# Patient Record
Sex: Male | Born: 1975
Health system: Southern US, Community
[De-identification: ages and names within clinical notes are randomized; demographics above are authoritative.]

---

## 1998-08-02 ENCOUNTER — Ambulatory Visit (HOSPITAL_COMMUNITY): Admission: RE | Admit: 1998-08-02 | Discharge: 1998-08-02 | Payer: Self-pay | Admitting: Family Medicine

## 1998-08-19 ENCOUNTER — Encounter: Admission: RE | Admit: 1998-08-19 | Discharge: 1998-08-19 | Payer: Self-pay | Admitting: *Deleted

## 1998-12-02 ENCOUNTER — Encounter: Payer: Self-pay | Admitting: Orthopedic Surgery

## 1998-12-02 ENCOUNTER — Ambulatory Visit (HOSPITAL_COMMUNITY): Admission: RE | Admit: 1998-12-02 | Discharge: 1998-12-02 | Payer: Self-pay | Admitting: Orthopedic Surgery

## 2017-03-30 DIAGNOSIS — R030 Elevated blood-pressure reading, without diagnosis of hypertension: Secondary | ICD-10-CM | POA: Diagnosis not present

## 2017-03-30 DIAGNOSIS — R079 Chest pain, unspecified: Secondary | ICD-10-CM | POA: Diagnosis not present

## 2017-03-30 DIAGNOSIS — M940 Chondrocostal junction syndrome [Tietze]: Secondary | ICD-10-CM | POA: Diagnosis not present

## 2017-12-06 DIAGNOSIS — Z0000 Encounter for general adult medical examination without abnormal findings: Secondary | ICD-10-CM | POA: Diagnosis not present

## 2018-11-22 DIAGNOSIS — L290 Pruritus ani: Secondary | ICD-10-CM | POA: Diagnosis not present

## 2018-11-22 DIAGNOSIS — H579 Unspecified disorder of eye and adnexa: Secondary | ICD-10-CM | POA: Diagnosis not present

## 2018-12-13 DIAGNOSIS — H9190 Unspecified hearing loss, unspecified ear: Secondary | ICD-10-CM | POA: Diagnosis not present

## 2018-12-13 DIAGNOSIS — Z0000 Encounter for general adult medical examination without abnormal findings: Secondary | ICD-10-CM | POA: Diagnosis not present

## 2018-12-13 DIAGNOSIS — Z13220 Encounter for screening for lipoid disorders: Secondary | ICD-10-CM | POA: Diagnosis not present

## 2020-02-22 ENCOUNTER — Emergency Department (HOSPITAL_COMMUNITY): Payer: 59

## 2020-02-22 ENCOUNTER — Other Ambulatory Visit: Payer: Self-pay

## 2020-02-22 ENCOUNTER — Inpatient Hospital Stay (HOSPITAL_COMMUNITY)
Admission: EM | Admit: 2020-02-22 | Discharge: 2020-02-24 | DRG: 482 | Disposition: A | Discharge status: Home / Self Care | Payer: 59 | Attending: Student | Admitting: Student

## 2020-02-22 ENCOUNTER — Encounter (HOSPITAL_COMMUNITY): Payer: Self-pay | Admitting: Emergency Medicine

## 2020-02-22 DIAGNOSIS — S72472A Torus fracture of lower end of left femur, initial encounter for closed fracture: Secondary | ICD-10-CM

## 2020-02-22 DIAGNOSIS — S72402A Unspecified fracture of lower end of left femur, initial encounter for closed fracture: Secondary | ICD-10-CM | POA: Diagnosis present

## 2020-02-22 DIAGNOSIS — X501XXA Overexertion from prolonged static or awkward postures, initial encounter: Secondary | ICD-10-CM

## 2020-02-22 DIAGNOSIS — T148XXA Other injury of unspecified body region, initial encounter: Secondary | ICD-10-CM

## 2020-02-22 DIAGNOSIS — S72352A Displaced comminuted fracture of shaft of left femur, initial encounter for closed fracture: Secondary | ICD-10-CM | POA: Diagnosis present

## 2020-02-22 DIAGNOSIS — Y92322 Soccer field as the place of occurrence of the external cause: Secondary | ICD-10-CM | POA: Diagnosis not present

## 2020-02-22 DIAGNOSIS — Y9366 Activity, soccer: Secondary | ICD-10-CM

## 2020-02-22 DIAGNOSIS — Z20822 Contact with and (suspected) exposure to covid-19: Secondary | ICD-10-CM | POA: Diagnosis present

## 2020-02-22 DIAGNOSIS — F17200 Nicotine dependence, unspecified, uncomplicated: Secondary | ICD-10-CM | POA: Diagnosis present

## 2020-02-22 DIAGNOSIS — Z419 Encounter for procedure for purposes other than remedying health state, unspecified: Secondary | ICD-10-CM

## 2020-02-22 DIAGNOSIS — W03XXXA Other fall on same level due to collision with another person, initial encounter: Secondary | ICD-10-CM | POA: Diagnosis present

## 2020-02-22 DIAGNOSIS — S72302A Unspecified fracture of shaft of left femur, initial encounter for closed fracture: Secondary | ICD-10-CM | POA: Diagnosis present

## 2020-02-22 DIAGNOSIS — M25562 Pain in left knee: Secondary | ICD-10-CM

## 2020-02-22 LAB — CBC WITH DIFFERENTIAL/PLATELET
Abs Immature Granulocytes: 0.05 10*3/uL (ref 0.00–0.07)
Basophils Absolute: 0 10*3/uL (ref 0.0–0.1)
Basophils Relative: 0 %
Eosinophils Absolute: 0 10*3/uL (ref 0.0–0.5)
Eosinophils Relative: 0 %
HCT: 40.9 % (ref 39.0–52.0)
Hemoglobin: 13.8 g/dL (ref 13.0–17.0)
Immature Granulocytes: 0 %
Lymphocytes Relative: 10 %
Lymphs Abs: 1.4 10*3/uL (ref 0.7–4.0)
MCH: 30.1 pg (ref 26.0–34.0)
MCHC: 33.7 g/dL (ref 30.0–36.0)
MCV: 89.3 fL (ref 80.0–100.0)
Monocytes Absolute: 0.5 10*3/uL (ref 0.1–1.0)
Monocytes Relative: 4 %
Neutro Abs: 11.8 10*3/uL — ABNORMAL HIGH (ref 1.7–7.7)
Neutrophils Relative %: 86 %
Platelets: 256 10*3/uL (ref 150–400)
RBC: 4.58 MIL/uL (ref 4.22–5.81)
RDW: 11.9 % (ref 11.5–15.5)
WBC: 13.8 10*3/uL — ABNORMAL HIGH (ref 4.0–10.5)
nRBC: 0 % (ref 0.0–0.2)

## 2020-02-22 LAB — RESPIRATORY PANEL BY RT PCR (FLU A&B, COVID)
Influenza A by PCR: NEGATIVE
Influenza B by PCR: NEGATIVE
SARS Coronavirus 2 by RT PCR: NEGATIVE

## 2020-02-22 LAB — BASIC METABOLIC PANEL
Anion gap: 11 (ref 5–15)
BUN: 18 mg/dL (ref 6–20)
CO2: 25 mmol/L (ref 22–32)
Calcium: 9.5 mg/dL (ref 8.9–10.3)
Chloride: 102 mmol/L (ref 98–111)
Creatinine, Ser: 1.08 mg/dL (ref 0.61–1.24)
GFR calc Af Amer: >60 mL/min (ref >60)
GFR calc non Af Amer: >60 mL/min (ref >60)
Glucose, Bld: 131 mg/dL — ABNORMAL HIGH (ref 70–99)
Potassium: 3.8 mmol/L (ref 3.5–5.1)
Sodium: 138 mmol/L (ref 135–145)

## 2020-02-22 MED ORDER — DOCUSATE SODIUM 100 MG PO CAPS
100.0000 mg | ORAL_CAPSULE | Freq: Two times a day (BID) | ORAL | Status: DC
  Administered 2020-02-23 – 2020-02-24 (×2): 100 mg via ORAL
  Filled 2020-02-22 (×2): qty 1

## 2020-02-22 MED ORDER — ONDANSETRON HCL 4 MG PO TABS: 4.0000 mg | ORAL_TABLET | Freq: Four times a day (QID) | ORAL | Status: DC | PRN

## 2020-02-22 MED ORDER — METOCLOPRAMIDE HCL 10 MG PO TABS: 5.0000 mg | ORAL_TABLET | Freq: Three times a day (TID) | ORAL | Status: DC | PRN

## 2020-02-22 MED ORDER — HYDROMORPHONE HCL 1 MG/ML IJ SOLN
1.0000 mg | INTRAMUSCULAR | Status: DC | PRN
  Administered 2020-02-22 – 2020-02-23 (×2): 1 mg via INTRAVENOUS
  Filled 2020-02-22 (×2): qty 1

## 2020-02-22 MED ORDER — OXYCODONE HCL 5 MG PO TABS
10.0000 mg | ORAL_TABLET | ORAL | Status: DC | PRN
  Administered 2020-02-23: 15 mg via ORAL
  Filled 2020-02-22: qty 3

## 2020-02-22 MED ORDER — ONDANSETRON HCL 4 MG/2ML IJ SOLN
4.0000 mg | Freq: Once | INTRAMUSCULAR | Status: AC
  Administered 2020-02-22: 4 mg via INTRAVENOUS
  Filled 2020-02-22: qty 2

## 2020-02-22 MED ORDER — ENOXAPARIN SODIUM 40 MG/0.4ML ~~LOC~~ SOLN
40.0000 mg | SUBCUTANEOUS | Status: DC
  Administered 2020-02-23: 40 mg via SUBCUTANEOUS
  Filled 2020-02-22: qty 0.4

## 2020-02-22 MED ORDER — POTASSIUM CHLORIDE IN NACL 20-0.9 MEQ/L-% IV SOLN
INTRAVENOUS | Status: DC
  Filled 2020-02-22: qty 1000

## 2020-02-22 MED ORDER — METHOCARBAMOL 1000 MG/10ML IJ SOLN
500.0000 mg | Freq: Four times a day (QID) | INTRAVENOUS | Status: DC | PRN
  Filled 2020-02-22: qty 5

## 2020-02-22 MED ORDER — ACETAMINOPHEN 325 MG PO TABS
650.0000 mg | ORAL_TABLET | Freq: Four times a day (QID) | ORAL | Status: DC
  Administered 2020-02-23 – 2020-02-24 (×6): 650 mg via ORAL
  Filled 2020-02-22 (×6): qty 2

## 2020-02-22 MED ORDER — ONDANSETRON HCL 4 MG/2ML IJ SOLN
4.0000 mg | Freq: Four times a day (QID) | INTRAMUSCULAR | Status: DC | PRN
  Administered 2020-02-23: 11:00:00 4 mg via INTRAVENOUS

## 2020-02-22 MED ORDER — METHOCARBAMOL 500 MG PO TABS: 500.0000 mg | ORAL_TABLET | Freq: Four times a day (QID) | ORAL | Status: DC | PRN

## 2020-02-22 MED ORDER — METOCLOPRAMIDE HCL 5 MG/ML IJ SOLN: 5.0000 mg | Freq: Three times a day (TID) | INTRAMUSCULAR | Status: DC | PRN

## 2020-02-22 MED ORDER — HYDROMORPHONE HCL 1 MG/ML IJ SOLN
0.5000 mg | Freq: Once | INTRAMUSCULAR | Status: AC
  Administered 2020-02-22: 0.5 mg via INTRAVENOUS
  Filled 2020-02-22: qty 1

## 2020-02-22 MED ORDER — OXYCODONE HCL 5 MG PO TABS: 5.0000 mg | ORAL_TABLET | ORAL | Status: DC | PRN

## 2020-02-22 NOTE — ED Notes (Signed)
C-Collar removed per MD.

## 2020-02-22 NOTE — H&P (Signed)
Orthopaedic Trauma Service (OTS) Consult   Patient ID: Paul [name] MRN: [id number] DOB/AGE: 45-04-1976 45 y.o.  Reason for Consult: Left distal femur fracture Referring Physician: Dr. Deno Etienne, MD [facility])  HPI: Paul [name] is an 44 y.o. male is being seen in consultation at the request of Dr. Tyrone Nine for left distal femur fracture. Patient states he was playing soccer this evening when he tried to avoid a collision with another player he planted his foot and twisted. [place] to the ground had immediate pain in the left leg. Was brought to the emergency department for evaluation found to have a left distal femur fracture. Orthopedic trauma service was consulted for evaluation and management.  Patient was seen in the emergency department, is relatively comfortable currently. Denies any numbness or tingling of the leg. Denies injury to any of his other extremities. Does have a history of left foot fracture from a forklift injury about 10 years ago that was treated nonoperatively, but denies any other injuries or surgeries to the left lower extremity. Denies any pain in the left leg leading up to this injury. Works as a Freight forwarder for [employer]. Lives at home with his wife. Takes no medications, including no anticoagulants. Denies any significant past medical history  History reviewed. No pertinent past medical history.  History reviewed. No pertinent surgical history.  No family history on file.  Social History:  has no history on file for tobacco, alcohol, and drug.  Allergies: No Known Allergies  Medications: I have reviewed the patient's current medications.  ROS: Constitutional: No fever or chills Vision: No changes in vision ENT: No difficulty swallowing CV: No chest pain Pulm: No SOB or wheezing GI: No nausea or vomiting GU: No urgency or inability to hold urine Skin: No poor wound healing Neurologic: No numbness or tingling Psychiatric: No  depression or anxiety Heme: No bruising Allergic: No reaction to medications or food   Exam: Blood pressure 112/80, pulse 95, temperature 97.7 F (36.5 C), SpO2 95 %. General: Sitting up in bed, NAD Orientation: awake, alert and oriented x 3 Mood and Affect: Mood and affect appropriate, pleasant and cooperative Gait: Not assessed due to known fracture Coordination and balance: Within normal limits  Left Lower Extremity: Leg held in external rotation. Deformity noted to distal thigh. Tenderness with palpation of the thigh, nontender below the knee. Sensation is intact to light touch distally. Able to wiggle toes. Distal thigh edematous but compartments compressible. Lower leg compartments soft and compressible. 2+ DP pulse  Right Lower Extremity: Skin without lesions. No tenderness to palpation. Full painless ROM, full strength in each muscle groups without evidence of instability.   Bilateral Upper Extremities: Skin without lesions. No tenderness to palpation. Full painless ROM, full strength in each muscle groups without evidence of instability.   Medical Decision Making: Data: Imaging: AP and lateral views of the left femur as well as knee shows comminuted, displaced, angulated fracture through the distal diaphysis  Labs:  Results for orders placed or performed during the hospital encounter of 02/22/20 (from the past 24 hour(s))  CBC with Differential     Status: Abnormal   Collection Time: 02/22/20  8:46 PM  Result Value Ref Range   WBC 13.8 (H) 4.0 - 10.5 K/uL   RBC 4.58 4.22 - 5.81 MIL/uL   Hemoglobin 13.8 13.0 - 17.0 g/dL   HCT 40.9 39.0 - 52.0 %   MCV 89.3 80.0 - 100.0 fL   MCH 30.1 26.0 -  34.0 pg   MCHC 33.7 30.0 - 36.0 g/dL   RDW 32.9 92.4 - 26.8 %   Platelets 256 150 - 400 K/uL   nRBC 0.0 0.0 - 0.2 %   Neutrophils Relative % 86 %   Neutro Abs 11.8 (H) 1.7 - 7.7 K/uL   Lymphocytes Relative 10 %   Lymphs Abs 1.4 0.7 - 4.0 K/uL   Monocytes Relative 4 %   Monocytes  Absolute 0.5 0.1 - 1.0 K/uL   Eosinophils Relative 0 %   Eosinophils Absolute 0.0 0.0 - 0.5 K/uL   Basophils Relative 0 %   Basophils Absolute 0.0 0.0 - 0.1 K/uL   Immature Granulocytes 0 %   Abs Immature Granulocytes 0.05 0.00 - 0.07 K/uL  Basic metabolic panel     Status: Abnormal   Collection Time: 02/22/20  8:46 PM  Result Value Ref Range   Sodium 138 135 - 145 mmol/L   Potassium 3.8 3.5 - 5.1 mmol/L   Chloride 102 98 - 111 mmol/L   CO2 25 22 - 32 mmol/L   Glucose, Bld 131 (H) 70 - 99 mg/dL   BUN 18 6 - 20 mg/dL   Creatinine, Ser 3.41 0.61 - 1.24 mg/dL   Calcium 9.5 8.9 - 96.2 mg/dL   GFR calc non Af Amer >60 >60 mL/min   GFR calc Af Amer >60 >60 mL/min   Anion gap 11 5 - 15  Respiratory Panel by RT PCR (Flu A&B, Covid) - Nasopharyngeal Swab     Status: None   Collection Time: 02/22/20  8:53 PM   Specimen: Nasopharyngeal Swab  Result Value Ref Range   SARS Coronavirus 2 by RT PCR NEGATIVE NEGATIVE   Influenza A by PCR NEGATIVE NEGATIVE   Influenza B by PCR NEGATIVE NEGATIVE   .  Assessment/Plan: 44 year old male s/p twisting injury with fall, resulting in left femur fracture.  Would recommend proceeding with intramedullary nailing of the left femur. Will plan to admit patient to orthopaedic trauma service this evening with plans for fixation of fracture tomorrow. Will be placed in Buck's traction. NPO effective midnight. Bedrest.   Shawn Route. Ladonna Snide [facility] [phone] (office) [email or web address]

## 2020-02-22 NOTE — ED Notes (Signed)
This RN called Chief Strategy Officer to place [employer]

## 2020-02-22 NOTE — ED Triage Notes (Signed)
Patient playing soccer, dug cleat into ground and caught.  Patient and other players heard the "pop".  Patient was given Ketamine 16mg  and 100 mcg of fentanyl en route to ED.  Patient has swelling and deformity above left knee.  Crepitus felt.  CSMTs and pulses intact.

## 2020-02-22 NOTE — ED Provider Notes (Signed)
[place] [facility] EMERGENCY DEPARTMENT Provider Note   CSN: [id number] Arrival date & time: 02/22/20  1945     History Chief Complaint  Patient presents with  . Fall  . Leg Pain    Paul [name] is a 43 y.o. male.  44 yo M with a chief complaints of left leg pain.  The patient was playing soccer and he was trying to avoid a collision with another player and his foot planted and he was struck by the other player and he felt like his leg twisted while his foot was held still.  Had pain and deformity to the leg just above the knee.  Was not ambulatory afterwards.  EMS felt like they had crepitus there.  Applied a splint.  Given pain medicine with some mild improvement.  Patient denies other injury.  Denies head injury loss consciousness denies chest pain back pain abdominal pain.  The history is provided by the patient.  Fall This is a new problem. The current episode started less than 1 hour ago. The problem occurs constantly. The problem has not changed since onset.Pertinent negatives include no chest pain, no abdominal pain, no headaches and no shortness of breath. The symptoms are aggravated by bending and twisting. Nothing relieves the symptoms. He has tried nothing for the symptoms. The treatment provided no relief.  Leg Pain Associated symptoms: no fever        History reviewed. No pertinent past medical history.  There are no problems to display for this patient.   History reviewed. No pertinent surgical history.     No family history on file.  Social History   Tobacco Use  . Smoking status: Not on file  Substance Use Topics  . Alcohol use: Not on file  . Drug use: Not on file    Home Medications Prior to Admission medications   Not on File    Allergies    Patient has no known allergies.  Review of Systems   Review of Systems  Constitutional: Negative for chills and fever.  HENT: Negative for congestion and facial swelling.   Eyes:  Negative for discharge and visual disturbance.  Respiratory: Negative for shortness of breath.   Cardiovascular: Negative for chest pain and palpitations.  Gastrointestinal: Negative for abdominal pain, diarrhea and vomiting.  Musculoskeletal: Positive for arthralgias and myalgias.  Skin: Negative for color change and rash.  Neurological: Negative for tremors, syncope and headaches.  Psychiatric/Behavioral: Negative for confusion and dysphoric mood.    Physical Exam Updated Vital Signs BP 112/80   Pulse 95   Temp 97.7 F (36.5 C)   SpO2 95%   Physical Exam Vitals and nursing note reviewed.  Constitutional:      Appearance: He is well-developed.  HENT:     Head: Normocephalic and atraumatic.  Eyes:     Pupils: Pupils are equal, round, and reactive to light.  Neck:     Vascular: No JVD.  Cardiovascular:     Rate and Rhythm: Normal rate and regular rhythm.     Heart sounds: No murmur. No friction rub. No gallop.   Pulmonary:     Effort: No respiratory distress.     Breath sounds: No wheezing.  Abdominal:     General: There is no distension.     Tenderness: There is no guarding or rebound.  Musculoskeletal:        General: Tenderness present. Normal range of motion.     Cervical back: Normal range of motion and  neck supple.     Comments: Pain and edema mostly just above the left knee.  External rotation of the leg distal to the knee.  Pulse motor and sensation are intact distally.  Skin:    Coloration: Skin is not pale.     Findings: No rash.  Neurological:     Mental Status: He is alert and oriented to person, place, and time.  Psychiatric:        Behavior: Behavior normal.     ED Results / Procedures / Treatments   Labs (all labs ordered are listed, but only abnormal results are displayed) Labs Reviewed  CBC WITH DIFFERENTIAL/PLATELET - Abnormal; Notable for the following components:      Result Value   WBC 13.8 (*)    Neutro Abs 11.8 (*)    All other  components within normal limits  BASIC METABOLIC PANEL - Abnormal; Notable for the following components:   Glucose, Bld 131 (*)    All other components within normal limits  RESPIRATORY PANEL BY RT PCR (FLU A&B, COVID)    EKG None  Radiology DG Knee 1-2 Views Left  Result Date: 02/22/2020 CLINICAL DATA:  44 year old male with trauma to the left lower extremity. EXAM: LEFT KNEE - 1-2 VIEW; LEFT FEMUR 2 VIEWS COMPARISON:  None. FINDINGS: There is a comminuted and displaced and angulated fracture of the distal left femoral diaphysis with dorsal angulation of the distal fracture fragment. There is no dislocation. The bones are well mineralized. There is soft tissue swelling of the distal thigh which may represent hematoma. IMPRESSION: Comminuted, displaced and angulated fracture of the distal left femoral diaphysis. Electronically Signed   By: Elgie Collard M.D.   On: 02/22/2020 20:47   DG Femur Min 2 Views Left  Result Date: 02/22/2020 CLINICAL DATA:  44 year old male with trauma to the left lower extremity. EXAM: LEFT KNEE - 1-2 VIEW; LEFT FEMUR 2 VIEWS COMPARISON:  None. FINDINGS: There is a comminuted and displaced and angulated fracture of the distal left femoral diaphysis with dorsal angulation of the distal fracture fragment. There is no dislocation. The bones are well mineralized. There is soft tissue swelling of the distal thigh which may represent hematoma. IMPRESSION: Comminuted, displaced and angulated fracture of the distal left femoral diaphysis. Electronically Signed   By: Elgie Collard M.D.   On: 02/22/2020 20:47    Procedures Procedures (including critical care time)  Medications Ordered in ED Medications  HYDROmorphone (DILAUDID) injection 0.5 mg (0.5 mg Intravenous Given 02/22/20 2127)  ondansetron (ZOFRAN) injection 4 mg (4 mg Intravenous Given 02/22/20 2127)    ED Course  I have reviewed the triage vital signs and the nursing notes.  Pertinent labs & imaging  results that were available during my care of the patient were reviewed by me and considered in my medical decision making (see chart for details).    MDM Rules/Calculators/A&P                      44 yo M with a chief complaint of left leg pain.  Patient has significant pain to the distal femur.  Concern for possible fracture.  Strange mechanism for such a strong bone.  Plain films.  Reassess.  Plain film with distal femur fx.  Discussed with ortho Dr. Jena Gauss. Will admit.   The patients results and plan were reviewed and discussed.   Any x-rays performed were independently reviewed by myself.   Differential diagnosis were considered with the presenting  HPI.  Medications  HYDROmorphone (DILAUDID) injection 0.5 mg (0.5 mg Intravenous Given 02/22/20 2127)  ondansetron (ZOFRAN) injection 4 mg (4 mg Intravenous Given 02/22/20 2127)    Vitals:   02/22/20 2000 02/22/20 2045 02/22/20 2052 02/22/20 2053  BP: 113/72  112/80   Pulse:  90 99 95  Temp:      SpO2:  98% 96% 95%    Final diagnoses:  Closed torus fracture of distal end of left femur, initial encounter (HCC)    Admission/ observation were discussed with the admitting physician, patient and/or family and they are comfortable with the plan.   Final Clinical Impression(s) / ED Diagnoses Final diagnoses:  Closed torus fracture of distal end of left femur, initial encounter [facility])    Rx / DC Orders ED Discharge Orders    None       Melene Plan, DO 02/22/20 2143

## 2020-02-23 ENCOUNTER — Encounter (HOSPITAL_COMMUNITY)
Admission: EM | Disposition: A | Discharge status: Home / Self Care | Payer: Self-pay | Source: Home / Self Care | Attending: Student

## 2020-02-23 ENCOUNTER — Inpatient Hospital Stay (HOSPITAL_COMMUNITY): Payer: 59

## 2020-02-23 ENCOUNTER — Other Ambulatory Visit: Payer: Self-pay

## 2020-02-23 ENCOUNTER — Encounter (HOSPITAL_COMMUNITY): Payer: Self-pay | Admitting: Student

## 2020-02-23 ENCOUNTER — Inpatient Hospital Stay (HOSPITAL_COMMUNITY): Payer: 59 | Admitting: Anesthesiology

## 2020-02-23 HISTORY — PX: FEMUR IM NAIL: SHX1597

## 2020-02-23 LAB — MRSA PCR SCREENING: MRSA by PCR: NEGATIVE

## 2020-02-23 SURGERY — INSERTION, INTRAMEDULLARY ROD, FEMUR, RETROGRADE
Anesthesia: General | Laterality: Left

## 2020-02-23 MED ORDER — CEFAZOLIN SODIUM-DEXTROSE 2-4 GM/100ML-% IV SOLN
INTRAVENOUS | Status: AC
  Filled 2020-02-23: qty 100

## 2020-02-23 MED ORDER — FENTANYL CITRATE (PF) 100 MCG/2ML IJ SOLN
INTRAMUSCULAR | Status: DC | PRN
  Administered 2020-02-23: 50 ug via INTRAVENOUS
  Administered 2020-02-23: 100 ug via INTRAVENOUS
  Administered 2020-02-23 (×2): 50 ug via INTRAVENOUS

## 2020-02-23 MED ORDER — PROMETHAZINE HCL 25 MG/ML IJ SOLN: 6.2500 mg | INTRAMUSCULAR | Status: DC | PRN

## 2020-02-23 MED ORDER — ACETAMINOPHEN 325 MG PO TABS
325.0000 mg | ORAL_TABLET | Freq: Once | ORAL | Status: DC | PRN
Start: 2020-02-23 — End: 2020-02-23

## 2020-02-23 MED ORDER — ACETAMINOPHEN 10 MG/ML IV SOLN: 1000.0000 mg | Freq: Once | INTRAVENOUS | Status: DC | PRN

## 2020-02-23 MED ORDER — FENTANYL CITRATE (PF) 250 MCG/5ML IJ SOLN
INTRAMUSCULAR | Status: AC
  Filled 2020-02-23: qty 5

## 2020-02-23 MED ORDER — DEXAMETHASONE SODIUM PHOSPHATE 10 MG/ML IJ SOLN
INTRAMUSCULAR | Status: AC
  Filled 2020-02-23: qty 1

## 2020-02-23 MED ORDER — CEFAZOLIN SODIUM-DEXTROSE 2-4 GM/100ML-% IV SOLN
2.0000 g | Freq: Three times a day (TID) | INTRAVENOUS | Status: AC
  Administered 2020-02-23 – 2020-02-24 (×3): 2 g via INTRAVENOUS
  Filled 2020-02-23 (×3): qty 100

## 2020-02-23 MED ORDER — MEPERIDINE HCL 25 MG/ML IJ SOLN
6.2500 mg | INTRAMUSCULAR | Status: DC | PRN
Start: 2020-02-23 — End: 2020-02-23

## 2020-02-23 MED ORDER — PROPOFOL 10 MG/ML IV BOLUS
INTRAVENOUS | Status: DC | PRN
  Administered 2020-02-23: 130 mg via INTRAVENOUS
  Administered 2020-02-23: 30 mg via INTRAVENOUS

## 2020-02-23 MED ORDER — PROPOFOL 10 MG/ML IV BOLUS
INTRAVENOUS | Status: AC
  Filled 2020-02-23: qty 20

## 2020-02-23 MED ORDER — POTASSIUM CHLORIDE IN NACL 20-0.9 MEQ/L-% IV SOLN
INTRAVENOUS | Status: DC
  Filled 2020-02-23 (×3): qty 1000

## 2020-02-23 MED ORDER — ACETAMINOPHEN 160 MG/5ML PO SOLN
325.0000 mg | Freq: Once | ORAL | Status: DC | PRN
Start: 2020-02-23 — End: 2020-02-23

## 2020-02-23 MED ORDER — ROCURONIUM BROMIDE 10 MG/ML (PF) SYRINGE
PREFILLED_SYRINGE | INTRAVENOUS | Status: AC
  Filled 2020-02-23: qty 10

## 2020-02-23 MED ORDER — LACTATED RINGERS IV SOLN: INTRAVENOUS | Status: DC

## 2020-02-23 MED ORDER — DEXAMETHASONE SODIUM PHOSPHATE 10 MG/ML IJ SOLN
INTRAMUSCULAR | Status: DC | PRN
  Administered 2020-02-23: 8 mg via INTRAVENOUS

## 2020-02-23 MED ORDER — ROCURONIUM BROMIDE 10 MG/ML (PF) SYRINGE
PREFILLED_SYRINGE | INTRAVENOUS | Status: DC | PRN
  Administered 2020-02-23: 50 mg via INTRAVENOUS

## 2020-02-23 MED ORDER — SUGAMMADEX SODIUM 200 MG/2ML IV SOLN
INTRAVENOUS | Status: DC | PRN
  Administered 2020-02-23: 200 mg via INTRAVENOUS

## 2020-02-23 MED ORDER — LIDOCAINE 2% (20 MG/ML) 5 ML SYRINGE
INTRAMUSCULAR | Status: AC
  Filled 2020-02-23: qty 5

## 2020-02-23 MED ORDER — MIDAZOLAM HCL 2 MG/2ML IJ SOLN
INTRAMUSCULAR | Status: AC
  Filled 2020-02-23: qty 2

## 2020-02-23 MED ORDER — LIDOCAINE 20MG/ML (2%) 15 ML SYRINGE OPTIME
INTRAMUSCULAR | Status: DC | PRN
  Administered 2020-02-23: 80 mg via INTRAVENOUS

## 2020-02-23 MED ORDER — CEFAZOLIN SODIUM-DEXTROSE 2-4 GM/100ML-% IV SOLN
2.0000 g | INTRAVENOUS | Status: AC
  Administered 2020-02-23: 2 g via INTRAVENOUS
  Filled 2020-02-23: qty 100

## 2020-02-23 MED ORDER — 0.9 % SODIUM CHLORIDE (POUR BTL) OPTIME
TOPICAL | Status: DC | PRN
  Administered 2020-02-23: 10:00:00 1000 mL

## 2020-02-23 MED ORDER — ONDANSETRON HCL 4 MG/2ML IJ SOLN
INTRAMUSCULAR | Status: AC
  Filled 2020-02-23: qty 2

## 2020-02-23 MED ORDER — HYDROMORPHONE HCL 1 MG/ML IJ SOLN: 0.2500 mg | INTRAMUSCULAR | Status: DC | PRN

## 2020-02-23 MED ORDER — MIDAZOLAM HCL 5 MG/5ML IJ SOLN
INTRAMUSCULAR | Status: DC | PRN
  Administered 2020-02-23: 2 mg via INTRAVENOUS

## 2020-02-23 SURGICAL SUPPLY — 67 items
ADH SKN CLS APL DERMABOND .7 (GAUZE/BANDAGES/DRESSINGS) ×2
APL PRP STRL LF DISP 70% ISPRP (MISCELLANEOUS) ×1
BIT DRILL CALIBRATED 4.3MMX365 (DRILL) IMPLANT
BIT DRILL CROWE PNT TWST 4.5MM (DRILL) IMPLANT
BNDG CMPR MED 15X6 ELC VLCR LF (GAUZE/BANDAGES/DRESSINGS) ×1
BNDG COHESIVE 4X5 TAN STRL (GAUZE/BANDAGES/DRESSINGS) ×3 IMPLANT
BNDG ELASTIC 4X5.8 VLCR STR LF (GAUZE/BANDAGES/DRESSINGS) ×3 IMPLANT
BNDG ELASTIC 6X15 VLCR STRL LF (GAUZE/BANDAGES/DRESSINGS) ×2 IMPLANT
BNDG ELASTIC 6X5.8 VLCR STR LF (GAUZE/BANDAGES/DRESSINGS) ×3 IMPLANT
BRUSH SCRUB EZ PLAIN DRY (MISCELLANEOUS) ×6 IMPLANT
CHLORAPREP W/TINT 26 (MISCELLANEOUS) ×3 IMPLANT
COVER MAYO STAND STRL (DRAPES) ×3 IMPLANT
COVER SURGICAL LIGHT HANDLE (MISCELLANEOUS) ×6 IMPLANT
COVER WAND RF STERILE (DRAPES) ×3 IMPLANT
DERMABOND ADVANCED (GAUZE/BANDAGES/DRESSINGS) ×4
DERMABOND ADVANCED .7 DNX12 (GAUZE/BANDAGES/DRESSINGS) ×1 IMPLANT
DRAPE C-ARM 42X72 X-RAY (DRAPES) ×3 IMPLANT
DRAPE C-ARMOR (DRAPES) ×3 IMPLANT
DRAPE HALF SHEET 40X57 (DRAPES) ×6 IMPLANT
DRAPE IMP U-DRAPE 54X76 (DRAPES) ×6 IMPLANT
DRAPE INCISE IOBAN 66X45 STRL (DRAPES) ×3 IMPLANT
DRAPE ORTHO SPLIT 77X108 STRL (DRAPES) ×6
DRAPE SURG 17X23 STRL (DRAPES) ×3 IMPLANT
DRAPE SURG ORHT 6 SPLT 77X108 (DRAPES) ×2 IMPLANT
DRAPE U-SHAPE 47X51 STRL (DRAPES) ×3 IMPLANT
DRILL CALIBRATED 4.3MMX365 (DRILL) ×3
DRILL CROWE POINT TWIST 4.5MM (DRILL) ×6
DRSG MEPILEX BORDER 4X4 (GAUZE/BANDAGES/DRESSINGS) ×6 IMPLANT
ELECT REM PT RETURN 9FT ADLT (ELECTROSURGICAL) ×3
ELECTRODE REM PT RTRN 9FT ADLT (ELECTROSURGICAL) ×1 IMPLANT
GAUZE SPONGE 4X4 12PLY STRL (GAUZE/BANDAGES/DRESSINGS) ×3 IMPLANT
GLOVE BIO SURGEON STRL SZ 6.5 (GLOVE) ×6 IMPLANT
GLOVE BIO SURGEON STRL SZ7.5 (GLOVE) ×12 IMPLANT
GLOVE BIO SURGEONS STRL SZ 6.5 (GLOVE) ×3
GLOVE BIOGEL PI IND STRL 6.5 (GLOVE) ×1 IMPLANT
GLOVE BIOGEL PI IND STRL 7.5 (GLOVE) ×1 IMPLANT
GLOVE BIOGEL PI INDICATOR 6.5 (GLOVE) ×2
GLOVE BIOGEL PI INDICATOR 7.5 (GLOVE) ×2
GOWN STRL REUS W/ TWL LRG LVL3 (GOWN DISPOSABLE) ×2 IMPLANT
GOWN STRL REUS W/TWL LRG LVL3 (GOWN DISPOSABLE) ×6
GUIDEPIN 3.2X17.5 THRD DISP (PIN) ×2 IMPLANT
GUIDEWIRE BEAD TIP (WIRE) ×2 IMPLANT
KIT BASIN OR (CUSTOM PROCEDURE TRAY) ×3 IMPLANT
KIT TURNOVER KIT B (KITS) ×3 IMPLANT
NAIL FEM RETRO 10.5X380 (Nail) ×2 IMPLANT
PACK ORTHO EXTREMITY (CUSTOM PROCEDURE TRAY) ×3 IMPLANT
PAD ARMBOARD 7.5X6 YLW CONV (MISCELLANEOUS) ×6 IMPLANT
PAD CAST 4YDX4 CTTN HI CHSV (CAST SUPPLIES) IMPLANT
PADDING CAST COTTON 4X4 STRL (CAST SUPPLIES) ×3
PADDING CAST COTTON 6X4 STRL (CAST SUPPLIES) ×2 IMPLANT
SCREW CORT TI DBL LEAD 5X36 (Screw) ×2 IMPLANT
SCREW CORT TI DBL LEAD 5X38 (Screw) ×2 IMPLANT
SCREW CORT TI DBL LEAD 5X56 (Screw) ×2 IMPLANT
SCREW CORT TI DBL LEAD 5X60 (Screw) ×2 IMPLANT
SCREW CORT TI DBL LEAD 5X70 (Screw) ×2 IMPLANT
SPONGE LAP 18X18 RF (DISPOSABLE) ×3 IMPLANT
STAPLER VISISTAT 35W (STAPLE) ×3 IMPLANT
SUT MNCRL AB 3-0 PS2 18 (SUTURE) ×3 IMPLANT
SUT VIC AB 0 CT1 27 (SUTURE)
SUT VIC AB 0 CT1 27XBRD ANBCTR (SUTURE) IMPLANT
SUT VIC AB 2-0 CT1 27 (SUTURE)
SUT VIC AB 2-0 CT1 TAPERPNT 27 (SUTURE) IMPLANT
TOWEL GREEN STERILE (TOWEL DISPOSABLE) ×6 IMPLANT
TOWEL GREEN STERILE FF (TOWEL DISPOSABLE) ×3 IMPLANT
TUBE CONNECTING 12'X1/4 (SUCTIONS) ×1
TUBE CONNECTING 12X1/4 (SUCTIONS) ×2 IMPLANT
YANKAUER SUCT BULB TIP NO VENT (SUCTIONS) ×3 IMPLANT

## 2020-02-23 NOTE — Anesthesia Procedure Notes (Signed)
Procedure Name: Intubation Date/Time: 02/23/2020 9:27 AM Performed by: Lowella Dell, CRNA Pre-anesthesia Checklist: Patient identified, Emergency Drugs available, Suction available and Patient being monitored Patient Re-evaluated:Patient Re-evaluated prior to induction Oxygen Delivery Method: Circle System Utilized Preoxygenation: Pre-oxygenation with 100% oxygen Induction Type: IV induction Ventilation: Mask ventilation without difficulty Laryngoscope Size: Mac and 4 Grade View: Grade I Tube type: Oral Tube size: 7.5 mm Number of attempts: 1 Airway Equipment and Method: Stylet Placement Confirmation: ETT inserted through vocal cords under direct vision,  positive ETCO2 and breath sounds checked- equal and bilateral Secured at: 22 cm Tube secured with: Tape Dental Injury: Teeth and Oropharynx as per pre-operative assessment

## 2020-02-23 NOTE — Plan of Care (Signed)
  Problem: Pain Managment: Goal: General experience of comfort will improve Outcome: Progressing   Problem: Safety: Goal: Ability to remain free from injury will improve Outcome: Progressing   

## 2020-02-23 NOTE — Plan of Care (Signed)

## 2020-02-23 NOTE — Op Note (Signed)
Orthopaedic Surgery Operative Note (CSN: [id number] ) Date of Surgery: 02/23/2020  Admit Date: 02/22/2020   Diagnoses: Pre-Op Diagnoses: Left distal femoral shaft fracture  Post-Op Diagnosis: Same  Procedures: CPT 27506-Intramedullary nailing of left femur fracture  Surgeons : Primary: Roby Lofts, MD  Assistant: Ulyses Southward, PA-C  Location: OR 3   Anesthesia:General  Antibiotics: Ancef 2g preop   Tourniquet time:None    Estimated Blood Loss: 75 mL  Complications:None  Specimens:None  Implants: Implant Name Type Inv. Item Serial No. Manufacturer Lot No. LRB No. Used Action  NAIL FEM RETRO 10.5X380 - [id number] Nail NAIL FEM RETRO 10.5X380  [employer](ORTH,TRAU,BIO,SG) [id number] Left 1 Implanted  SCREW CORT TI DBL LEAD 5X56 - [id number] Screw SCREW CORT TI DBL LEAD 5X56  [employer](ORTH,TRAU,BIO,SG) [id number] Left 1 Implanted  SCREW CORT TI DBL LEAD 5X70 - [id number] Screw SCREW CORT TI DBL LEAD 5X70  [employer](ORTH,TRAU,BIO,SG) [id number] Left 1 Implanted  SCREW CORT TI DBL LEAD 5X60 - [id number] Screw SCREW CORT TI DBL LEAD 5X60  [employer](ORTH,TRAU,BIO,SG) [id number] Left 1 Implanted  SCREW CORT TI DBL LEAD 5X38 - [id number] Screw SCREW CORT TI DBL LEAD 5X38  [employer](ORTH,TRAU,BIO,SG) [id number] Left 1 Implanted  SCREW CORT TI DBL LEAD 5X36 - [id number] Screw SCREW CORT TI DBL LEAD 5X36  [employer](ORTH,TRAU,BIO,SG) [id number] Left 1 Implanted     Indications for Surgery: 44 year old male who was playing soccer and twisted and got hit in his leg.  He had immediate pain and inability bear weight.  X-ray showed a distal femoral shaft fracture on the left.  I recommended proceeding with intramedullary nailing of his left femur.  Risks and benefits were discussed with the patient.  Risks included but not limited to bleeding, infection, malunion, nonunion, hardware failure, hardware irritation, nerve and blood vessel injury, DVT, knee stiffness, even the possibility of anesthetic  complications.  The patient agreed to proceed with surgery and consent was obtained.  Operative Findings: Retrograde intramedullary nailing of left femur using [employer] [place] nail 10.5 x 380 mm  Procedure: The patient was identified in the preoperative holding area. Consent was confirmed with the patient and their family and all questions were answered. The operative extremity was marked after confirmation with the patient. he was then brought back to the operating room by our anesthesia colleagues.  He was placed under general anesthetic and carefully transferred over to a radiolucent flat top table.  The left lower extremity was prepped and draped in usual sterile fashion.  A timeout was performed to verify the patient, the procedure, and the extremity.  Preoperative antibiotics were dosed.  Fluoroscopic imaging was obtained to show the unstable nature of his injury.  A reduction maneuver was performed with traction with the hip and knee flexed over a triangle.  Reduction was obtained.  A medial parapatellar incision was carried through skin subcutaneous tissue the retinaculum was split just medial to the patella tendon.  A curved Mayo was used to enter the knee joint.  A threaded guidewire was then used to find the appropriate starting point on AP and lateral fluoroscopic imaging.  It was directed in the center of the metaphysis in the distal femur.  An entry reamer was then used to enter the canal.  A ball-tipped guidewire was placed down the center canal and passed across the fracture into the proximal femur.  It was seated into the intertrochanteric femur region.  I measured the length and chose to use a 380 mm nail.  I  then sequentially reamed from 8.5 mm all the way to 12 mm.  Excellent chatter was obtained.  I chose to place a 10.5 mm nail.  The nail was passed down the center canal reduction was maintained.  It was seated until the distal portion of the nail was flush underneath the  articular surface of the knee.  Using the jig I was able to place 3 distal interlocking screws from lateral to medial.  I then compared rotation of the contralateral limb and with the knee in full extension I then used perfect circle technique to place proximal interlocking screws from anterior to posterior.  Final fluoroscopic imaging was obtained.  The incisions were copiously irrigated.  The incisions were closed with 2-0 Vicryl and 3-0 Monocryl.  The incisions were sealed with Dermabond.  Sterile dressings were placed.  Rotation was compared to the contralateral side which was symmetric.  The patient was awoken from anesthesia and taken to the PACU in stable condition.  Post Op Plan/Instructions: Patient will be weightbearing as tolerated to the left lower extremity.  He will receive postoperative Ancef.  He will receive Lovenox for DVT prophylaxis while inpatient and discharged home on aspirin 325 mg twice daily.  We will have him mobilize with physical therapy and likely discharge home on postoperative day 1 versus 2.  I was present and performed the entire surgery.  Patrecia Pace, PA-C did assist me throughout the case. An assistant was necessary given the difficulty in approach, maintenance of reduction and ability to instrument the fracture.   Katha Hamming, MD [facility]

## 2020-02-23 NOTE — Anesthesia Preprocedure Evaluation (Addendum)
Anesthesia Evaluation  Patient identified by MRN, date of birth, ID band Patient awake    Reviewed: Allergy & Precautions, NPO status , Patient's Chart, lab work & pertinent test results  Airway Mallampati: I  TM Distance: >3 FB Neck ROM: Full    Dental  (+) Teeth Intact, Dental Advisory Given   Pulmonary Current Smoker,    Pulmonary exam normal        Cardiovascular negative cardio ROS   Rhythm:Regular Rate:Normal     Neuro/Psych negative neurological ROS  negative psych ROS   GI/Hepatic negative GI ROS, Neg liver ROS,   Endo/Other  negative endocrine ROS  Renal/GU negative Renal ROS     Musculoskeletal negative musculoskeletal ROS (+)   Abdominal Normal abdominal exam  (+)   Peds  Hematology negative hematology ROS (+)   Anesthesia Other Findings   Reproductive/Obstetrics                            Anesthesia Physical Anesthesia Plan  ASA: I  Anesthesia Plan: General   Post-op Pain Management:    Induction: Intravenous  PONV Risk Score and Plan: 2 and Ondansetron and Midazolam  Airway Management Planned: Oral ETT  Additional Equipment: None  Intra-op Plan:   Post-operative Plan: Extubation in OR  Informed Consent: I have reviewed the patients History and Physical, chart, labs and discussed the procedure including the risks, benefits and alternatives for the proposed anesthesia with the patient or authorized representative who has indicated his/her understanding and acceptance.       Plan Discussed with: CRNA  Anesthesia Plan Comments:        Anesthesia Quick Evaluation

## 2020-02-23 NOTE — Transfer of Care (Signed)
Immediate Anesthesia Transfer of Care Note  Patient: Paul [name]  Procedure(s) Performed: INTRAMEDULLARY (IM) RETROGRADE FEMORAL NAILING (Left )  Patient Location: PACU  Anesthesia Type:General  Level of Consciousness: awake, alert  and patient cooperative  Airway & Oxygen Therapy: Patient Spontanous Breathing  Post-op Assessment: Report given to RN and Post -op Vital signs reviewed and stable  Post vital signs: Reviewed and stable  Last Vitals:  Vitals Value Taken Time  BP 119/76 02/23/20 1110  Temp    Pulse 101 02/23/20 1111  Resp 17 02/23/20 1111  SpO2 100 % 02/23/20 1111  Vitals shown include unvalidated device data.  Last Pain:  Vitals:   02/23/20 0810  TempSrc:   PainSc: 1          Complications: No apparent anesthesia complications

## 2020-02-23 NOTE — Anesthesia Postprocedure Evaluation (Signed)
Anesthesia Post Note  Patient: Paul [name]  Procedure(s) Performed: INTRAMEDULLARY (IM) RETROGRADE FEMORAL NAILING (Left )     Patient location during evaluation: PACU Anesthesia Type: General Level of consciousness: awake and alert Pain management: pain level controlled Vital Signs Assessment: post-procedure vital signs reviewed and stable Respiratory status: spontaneous breathing, nonlabored ventilation, respiratory function stable and patient connected to nasal cannula oxygen Cardiovascular status: blood pressure returned to baseline and stable Postop Assessment: no apparent nausea or vomiting Anesthetic complications: no    Last Vitals:  Vitals:   02/23/20 1134 02/23/20 1202  BP: 130/78 121/74  Pulse: 77 77  Resp: 15 16  Temp: 36.7 C 36.8 C  SpO2: 100% 100%    Last Pain:  Vitals:   02/23/20 1202  TempSrc: Oral  PainSc:                  Shelton Silvas

## 2020-02-24 ENCOUNTER — Encounter: Payer: Self-pay | Admitting: *Deleted

## 2020-02-24 LAB — CBC
HCT: 37 % — ABNORMAL LOW (ref 39.0–52.0)
Hemoglobin: 12.3 g/dL — ABNORMAL LOW (ref 13.0–17.0)
MCH: 30.2 pg (ref 26.0–34.0)
MCHC: 33.2 g/dL (ref 30.0–36.0)
MCV: 90.9 fL (ref 80.0–100.0)
Platelets: 250 10*3/uL (ref 150–400)
RBC: 4.07 MIL/uL — ABNORMAL LOW (ref 4.22–5.81)
RDW: 12.3 % (ref 11.5–15.5)
WBC: 11.4 10*3/uL — ABNORMAL HIGH (ref 4.0–10.5)
nRBC: 0 % (ref 0.0–0.2)

## 2020-02-24 MED ORDER — METHOCARBAMOL 500 MG PO TABS: 500.0000 mg | ORAL_TABLET | Freq: Four times a day (QID) | ORAL | 0 refills | Status: AC | PRN

## 2020-02-24 MED ORDER — ASPIRIN EC 325 MG PO TBEC
325.0000 mg | DELAYED_RELEASE_TABLET | Freq: Two times a day (BID) | ORAL | 0 refills | Status: AC
Start: 2020-02-24 — End: 2020-03-25

## 2020-02-24 MED ORDER — OXYCODONE-ACETAMINOPHEN 5-325 MG PO TABS: 1.0000 | ORAL_TABLET | ORAL | 0 refills | Status: AC | PRN

## 2020-02-24 NOTE — TOC Transition Note (Signed)
Transition of Care [facility]) - CM/SW Discharge Note   Patient Details  Name: Paul [name] MRN: [id number] Date of Birth: 10-14-1976  Transition of Care [facility]) CM/SW Contact:  Epifanio Lesches, RN Phone Number: [phone] 02/24/2020, 12:21 PM   Clinical Narrative:    Admitted with L distal femur fx from playing soccer.      -S/p L femoral IM nail 4/26  Pt will transition to home today. Outpatient PT referral made with [facility] and noted on AVS. DME; rolling walker will be delivered to beside prior to d/c.  Wife to provide transportation to home. Pt without Rx med concerns .... affordability.  Final next level of care: Home/Self Care Barriers to Discharge: No Barriers Identified   Patient Goals and CMS Choice     Choice offered to / list presented to : Patient  Discharge Placement                       Discharge Plan and Services   Discharge Planning Services: CM Consult            DME Arranged: Dan Humphreys rolling DME Agency: [employer]           Date HH Agency Contacted: 02/24/20 Time HH Agency Contacted: [phone] Representative spoke with at [facility] Agency: Zack  Social Determinants of Health (SDOH) Interventions     Readmission Risk Interventions No flowsheet data found.

## 2020-02-24 NOTE — Discharge Instructions (Signed)
Orthopaedic Trauma Service Discharge Instructions   General Discharge Instructions  WEIGHT BEARING STATUS: Weightbearing as tolerated left leg  RANGE OF MOTION/ACTIVITY: Okay for unrestricted knee and hip motion  Wound Care: You may remove your surgical dressings on post op day # 2 (Wednesday 02/25/20).  Incisions can be left open to air if there is no drainage. If incision continues to have drainage, follow wound care instructions below. Okay to shower if no drainage from incisions.  DVT/PE prophylaxis: Aspirin 325 mg twice daily  Diet: as you were eating previously.  Can use over the counter stool softeners and bowel preparations, such as Miralax, to help with bowel movements.  Narcotics can be constipating.  Be sure to drink plenty of fluids  PAIN MEDICATION USE AND EXPECTATIONS  You have likely been given narcotic medications to help control your pain.  After a traumatic event that results in an fracture (broken bone) with or without surgery, it is ok to use narcotic pain medications to help control one's pain.  We understand that everyone responds to pain differently and each individual patient will be evaluated on a regular basis for the continued need for narcotic medications. Ideally, narcotic medication use should last no more than 6-8 weeks (coinciding with fracture healing).   As a patient it is your responsibility as well to monitor narcotic medication use and report the amount and frequency you use these medications when you come to your office visit.   We would also advise that if you are using narcotic medications, you should take a dose prior to therapy to maximize you participation.  IF YOU ARE ON NARCOTIC MEDICATIONS IT IS NOT PERMISSIBLE TO OPERATE A MOTOR VEHICLE (MOTORCYCLE/CAR/TRUCK/MOPED) OR HEAVY MACHINERY DO NOT MIX NARCOTICS WITH OTHER CNS (CENTRAL NERVOUS SYSTEM) DEPRESSANTS SUCH AS ALCOHOL   STOP SMOKING OR USING NICOTINE PRODUCTS!!!!  As discussed nicotine  severely impairs your body's ability to heal surgical and traumatic wounds but also impairs bone healing.  Wounds and bone heal by forming microscopic blood vessels (angiogenesis) and nicotine is a vasoconstrictor (essentially, shrinks blood vessels).  Therefore, if vasoconstriction occurs to these microscopic blood vessels they essentially disappear and are unable to deliver necessary nutrients to the healing tissue.  This is one modifiable factor that you can do to dramatically increase your chances of healing your injury.    (This means no smoking, no nicotine gum, patches, etc)  DO NOT USE NONSTEROIDAL ANTI-INFLAMMATORY DRUGS (NSAID'S)  Using products such as Advil (ibuprofen), Aleve (naproxen), Motrin (ibuprofen) for additional pain control during fracture healing can delay and/or prevent the healing response.  If you would like to take over the counter (OTC) medication, Tylenol (acetaminophen) is ok.  However, some narcotic medications that are given for pain control contain acetaminophen as well. Therefore, you should not exceed more than 4000 mg of tylenol in a day if you do not have liver disease.  Also note that there are may OTC medicines, such as cold medicines and allergy medicines that my contain tylenol as well.  If you have any questions about medications and/or interactions please ask your doctor/PA or your pharmacist.      ICE AND ELEVATE INJURED/OPERATIVE EXTREMITY  Using ice and elevating the injured extremity above your heart can help with swelling and pain control.  Icing in a pulsatile fashion, such as 20 minutes on and 20 minutes off, can be followed.    Do not place ice directly on skin. Make sure there is a barrier between  to skin and the ice pack.    Using frozen items such as frozen peas works well as the conform nicely to the are that needs to be iced.  USE AN ACE WRAP OR TED HOSE FOR SWELLING CONTROL  In addition to icing and elevation, Ace wraps or TED hose are used to  help limit and resolve swelling.  It is recommended to use Ace wraps or TED hose until you are informed to stop.    When using Ace Wraps start the wrapping distally (farthest away from the body) and wrap proximally (closer to the body)   Example: If you had surgery on your leg or thing and you do not have a splint on, start the ace wrap at the toes and work your way up to the thigh        If you had surgery on your upper extremity and do not have a splint on, start the ace wrap at your fingers and work your way up to the upper arm   [place]: [phone]   VISIT OUR WEBSITE FOR ADDITIONAL INFORMATION: [email or web address]     Discharge Wound Care Instructions  Do NOT apply any ointments, solutions or lotions to pin sites or surgical wounds.  These prevent needed drainage and even though solutions like hydrogen peroxide kill bacteria, they also damage cells lining the pin sites that help fight infection.  Applying lotions or ointments can keep the wounds moist and can cause them to breakdown and open up as well. This can increase the risk for infection. When in doubt call the office.  Surgical incisions should be dressed daily.  If any drainage is noted, use one layer of adaptic, then gauze, Kerlix, and an ace wrap.  Once the incision is completely dry and without drainage, it may be left open to air out.  Showering may begin 36-48 hours later.  Cleaning gently with soap and water.  Traumatic wounds should be dressed daily as well.    One layer of adaptic, gauze, Kerlix, then ace wrap.  The adaptic can be discontinued once the draining has ceased    If you have a wet to dry dressing: wet the gauze with saline the squeeze as much saline out so the gauze is moist (not soaking wet), place moistened gauze over wound, then place a dry gauze over the moist one, followed by Kerlix wrap, then ace wrap.

## 2020-02-24 NOTE — Discharge Summary (Signed)
Orthopaedic Trauma Service (OTS) Discharge Summary   Patient ID: Paul [name] MRN: [id number] DOB/AGE: [birth year] 44 y.o.  Admit date: 02/22/2020 Discharge date: 02/24/2020  Admission Diagnoses: Left distal femur fracture  Discharge Diagnoses:  Active Problems:   Closed fracture of left distal femur ([place])   History reviewed. No pertinent past medical history.   Procedures Performed: Intramedullary nailing of left femur fracture  Discharged Condition: good  Hospital Course: Patient presented to [facility] emergency department for evaluation of left leg pain on 02/22/2020 after a fall during a soccer game.  Was found to have a left distal femur fracture.  Orthopedic trauma service was consulted for evaluation and management.  Patient was placed in Buck's traction and admitted to the orthopedic trauma service.  Was taken to the operating room on 02/23/2020 by Dr. Doreatha Martin for the above procedure.  Tolerated the procedure well without complication.  Was instructed to be weightbearing as tolerated on the left lower extremity postoperatively.  Started on Lovenox for DVT prophylaxis starting on postoperative day #1.  Began working with physical and occupational therapy starting on postoperative day #1. On 02/24/2020, the patient was tolerating diet, working well with therapies, pain well controlled, vital signs stable, dressings clean, dry, intact and felt stable for discharge to  home. Patient will follow up as below and knows to call with questions or concerns.     Consults: None  Significant Diagnostic Studies:   Results for orders placed or performed during the hospital encounter of 02/22/20 (from the past 168 hour(s))  CBC with Differential   Collection Time: 02/22/20  8:46 PM  Result Value Ref Range   WBC 13.8 (H) 4.0 - 10.5 K/uL   RBC 4.58 4.22 - 5.81 MIL/uL   Hemoglobin 13.8 13.0 - 17.0 g/dL   HCT 40.9 39.0 - 52.0 %   MCV 89.3 80.0 - 100.0 fL   MCH 30.1 26.0 - 34.0 pg     MCHC 33.7 30.0 - 36.0 g/dL   RDW 11.9 11.5 - 15.5 %   Platelets 256 150 - 400 K/uL   nRBC 0.0 0.0 - 0.2 %   Neutrophils Relative % 86 %   Neutro Abs 11.8 (H) 1.7 - 7.7 K/uL   Lymphocytes Relative 10 %   Lymphs Abs 1.4 0.7 - 4.0 K/uL   Monocytes Relative 4 %   Monocytes Absolute 0.5 0.1 - 1.0 K/uL   Eosinophils Relative 0 %   Eosinophils Absolute 0.0 0.0 - 0.5 K/uL   Basophils Relative 0 %   Basophils Absolute 0.0 0.0 - 0.1 K/uL   Immature Granulocytes 0 %   Abs Immature Granulocytes 0.05 0.00 - 0.07 K/uL  Basic metabolic panel   Collection Time: 02/22/20  8:46 PM  Result Value Ref Range   Sodium 138 135 - 145 mmol/L   Potassium 3.8 3.5 - 5.1 mmol/L   Chloride 102 98 - 111 mmol/L   CO2 25 22 - 32 mmol/L   Glucose, Bld 131 (H) 70 - 99 mg/dL   BUN 18 6 - 20 mg/dL   Creatinine, Ser 1.08 0.61 - 1.24 mg/dL   Calcium 9.5 8.9 - 10.3 mg/dL   GFR calc non Af Amer >60 >60 mL/min   GFR calc Af Amer >60 >60 mL/min   Anion gap 11 5 - 15  Respiratory Panel by RT PCR (Flu A&B, Covid) - Nasopharyngeal Swab   Collection Time: 02/22/20  8:53 PM   Specimen: Nasopharyngeal Swab  Result Value Ref Range  SARS Coronavirus 2 by RT PCR NEGATIVE NEGATIVE   Influenza A by PCR NEGATIVE NEGATIVE   Influenza B by PCR NEGATIVE NEGATIVE  MRSA PCR Screening   Collection Time: 02/23/20  4:12 AM   Specimen: Nasal Mucosa; Nasopharyngeal  Result Value Ref Range   MRSA by PCR NEGATIVE NEGATIVE  CBC   Collection Time: 02/24/20  3:31 AM  Result Value Ref Range   WBC 11.4 (H) 4.0 - 10.5 K/uL   RBC 4.07 (L) 4.22 - 5.81 MIL/uL   Hemoglobin 12.3 (L) 13.0 - 17.0 g/dL   HCT 76.7 (L) 34.1 - 93.7 %   MCV 90.9 80.0 - 100.0 fL   MCH 30.2 26.0 - 34.0 pg   MCHC 33.2 30.0 - 36.0 g/dL   RDW 90.2 40.9 - 73.5 %   Platelets 250 150 - 400 K/uL   nRBC 0.0 0.0 - 0.2 %     Treatments: IV hydration, antibiotics: Ancef, analgesia: acetaminophen, Dilaudid and oxycodone, anticoagulation: LMW heparin, therapies: PT and  OT and surgery: Intramedullary nailing left distal femur fracture  Discharge Exam: General: Sitting up in bed, no acute distress.  Eating breakfast Respiratory: No increased work of breathing at rest Left lower extremity: Ace wrap in place from toes to thigh.  Dressing is clean, dry, intact.  Mildly tender with palpation to the distal thigh.  Nontender in the lower leg, ankle, foot.  Compartments are soft and compressible.  Ankle dorsiflexion plantarflexion is intact.  Knee flexion to about 65 degrees.+ EHL.+ FHL.  2+ DP pulse  Disposition: Discharge disposition: 01-Home or Self Care       Discharge Instructions    Ambulatory referral to Physical Therapy   Complete by: As directed      Allergies as of 02/24/2020   No Known Allergies     Medication List    STOP taking these medications   ibuprofen 200 MG tablet Commonly known as: ADVIL   triamcinolone cream 0.1 % Commonly known as: KENALOG     TAKE these medications   acetaminophen 500 MG tablet Commonly known as: TYLENOL Take 1,000 mg by mouth every 6 (six) hours as needed for headache (pain).   aspirin EC 325 MG tablet Take 1 tablet (325 mg total) by mouth in the morning and at bedtime.   methocarbamol 500 MG tablet Commonly known as: ROBAXIN Take 1 tablet (500 mg total) by mouth every 6 (six) hours as needed for muscle spasms.   oxyCODONE-acetaminophen 5-325 MG tablet Commonly known as: Percocet Take 1 tablet by mouth every 4 (four) hours as needed for severe pain.            Durable Medical Equipment  (From admission, onward)         Start     Ordered   02/24/20 1156  For home use only DME Bedside commode  Once    Question:  Patient needs a bedside commode to treat with the following condition  Answer:  Closed fracture of left distal femur (HCC)   02/24/20 1156   02/24/20 0947  For home use only DME Walker rolling  Once    Question Answer Comment  Walker: With 5 Inch Wheels   Patient needs a walker  to treat with the following condition Femur fracture (HCC)      02/24/20 0946         Follow-up Information    Haddix, Gillie Manners, MD. Schedule an appointment as soon as possible for a visit in 2 week(s).  Specialty: Orthopedic Surgery Why: For repeat x-rays Contact information: [address] [place] [address] [phone]           Discharge Instructions and Plan: Patient will be discharged to home. Will be discharged on Aspirin 325 mg twice daily for DVT prophylaxis. Patient has been provided with all the necessary DME for discharge. Patient will follow up with Dr. Jena Gauss in 2 weeks for repeat x-rays and suture removal.   Signed:  Shawn Route. Ladonna Snide ?[phone]? (phone) 02/24/2020, 12:20 PM  [facility] [address] [place] [address] [phone] (F)

## 2020-02-24 NOTE — Plan of Care (Signed)
  Problem: Education: Goal: Knowledge of General Education information will improve Description: Including pain rating scale, medication(s)/side effects and non-pharmacologic comfort measures Outcome: Progressing   Problem: Health Behavior/Discharge Planning: Goal: Ability to manage health-related needs will improve Outcome: Progressing   Problem: Clinical Measurements: Goal: Will remain free from infection Outcome: Progressing   Problem: Pain Managment: Goal: General experience of comfort will improve Outcome: Progressing   Problem: Safety: Goal: Ability to remain free from injury will improve Outcome: Progressing   Problem: Skin Integrity: Goal: Risk for impaired skin integrity will decrease Outcome: Progressing   

## 2020-02-24 NOTE — Evaluation (Signed)
Physical Therapy Evaluation Patient Details Name: Paul [name] MRN: [id number] DOB: Feb 12, 1976 Today's Date: 02/24/2020   History of Present Illness  Pt is a 44 y.o. male admitted 02/23/20 with L distal femur fx from playing soccer. S/p L femoral IM nail 4/26 ([facility]). No pertinent PMH on file.    Clinical Impression  Pt presents with an overall decrease in functional mobility secondary to above. PTA, pt independent, works, active and lives with family. Educ on precautions, positioning, therex, and importance of mobility. Today, pt able to trial transfer and gait training with RW and crutches; ultimately prefers use of RW, but may also try to borrow crutches from a friend. Pt would benefit from continued acute PT services to maximize functional mobility and independence prior to d/c with outpatient orthopedic PT.     Follow Up Recommendations Outpatient PT;Supervision - Intermittent    Equipment Recommendations  Rolling walker with 5" wheels    Recommendations for Other Services       Precautions / Restrictions Precautions Precautions: Fall Restrictions Weight Bearing Restrictions: Yes LLE Weight Bearing: Weight bearing as tolerated      Mobility  Bed Mobility Overal bed mobility: Modified Independent             General bed mobility comments: HOB slightly elevated, use of BUE support to manage LLE  Transfers Overall transfer level: Needs assistance Equipment used: Rolling walker (2 wheeled);Crutches Transfers: Sit to/from Stand Sit to Stand: Supervision         General transfer comment: Trialled transfers with RW and bilateral crutches, cues for technique; supervision for safety. Pt states liking stability of RW  Ambulation/Gait Ambulation/Gait assistance: Supervision Gait Distance (Feet): 50 Feet Assistive device: Rolling walker (2 wheeled);Crutches Gait Pattern/deviations: Step-through pattern;Decreased weight shift to left;Antalgic Gait velocity:  Decreased Gait velocity interpretation: <1.8 ft/sec, indicate of risk for recurrent falls General Gait Details: Trialed gait training with RW and bilateral crutches, seated rest in between, cues for technique and encouraged WBAT through LLE; pt able to touch heel to ground but endorses increased pain with this  Stairs Stairs: (Educ on stair training with L-side rail support, sequencing with painful LLE; pt reports understanding and declines training secondary to pain)          Wheelchair Mobility    Modified Rankin (Stroke Patients Only)       Balance Overall balance assessment: Needs assistance   Sitting balance-Leahy Scale: Good       Standing balance-Leahy Scale: Fair Standing balance comment: Can static stand without UE support; reliant on UE support for dynamic stability                             Pertinent Vitals/Pain Pain Assessment: 0-10 Pain Score: 3  Pain Location: L thigh/knee Pain Descriptors / Indicators: Sore;Discomfort Pain Intervention(s): Monitored during session;Repositioned    Home Living Family/patient expects to be discharged to:: Private residence Living Arrangements: Spouse/significant other Available Help at Discharge: Family;Available 24 hours/day Type of Home: House Home Access: Stairs to enter Entrance Stairs-Rails: Doctor, general practice of Steps: 5 Home Layout: One level Home Equipment: None Additional Comments: Lives with wife and children; daughter about to start undergrad at [employer]    Prior Function Level of Independence: Independent         Comments: Works. Enjoys playing on soccer league; has played soccer his whole life     Hand Dominance   Dominant Hand: Right    Extremity/Trunk Assessment  Upper Extremity Assessment Upper Extremity Assessment: Overall WFL for tasks assessed    Lower Extremity Assessment Lower Extremity Assessment: LLE deficits/detail LLE Deficits / Details: s/p L distal  femoral IMN; able to achieve LAQ and knee flexion, ankle DF/PF at least 3/5; unable to perform full SLR but can do quad set LLE: Unable to fully assess due to immobilization LLE Coordination: decreased gross motor    Cervical / Trunk Assessment Cervical / Trunk Assessment: Normal  Communication   Communication: No difficulties  Cognition Arousal/Alertness: Awake/alert Behavior During Therapy: WFL for tasks assessed/performed Overall Cognitive Status: Within Functional Limits for tasks assessed                                        General Comments General comments (skin integrity, edema, etc.): Educ on therex/rom, edema control, precautions, positioning, DVT prevention    Exercises Other Exercises Other Exercises: Seated LAQ, knee flexion   Assessment/Plan    PT Assessment Patient needs continued PT services  PT Problem List Decreased strength;Decreased range of motion;Decreased activity tolerance;Decreased balance;Decreased mobility;Pain;Decreased knowledge of use of DME       PT Treatment Interventions DME instruction;Gait training;Stair training;Functional mobility training;Therapeutic activities;Therapeutic exercise;Balance training;Patient/family education    PT Goals (Current goals can be found in the Care Plan section)  Acute Rehab PT Goals Patient Stated Goal: Home soon; return to playing soccer PT Goal Formulation: With patient Time For Goal Achievement: 03/09/20 Potential to Achieve Goals: Good    Frequency Min 5X/week   Barriers to discharge        Co-evaluation               AM-PAC PT "6 Clicks" Mobility  Outcome Measure Help needed turning from your back to your side while in a flat bed without using bedrails?: None Help needed moving from lying on your back to sitting on the side of a flat bed without using bedrails?: None Help needed moving to and from a bed to a chair (including a wheelchair)?: A Little Help needed standing up  from a chair using your arms (e.g., wheelchair or bedside chair)?: A Little Help needed to walk in hospital room?: A Little Help needed climbing 3-5 steps with a railing? : A Little 6 Click Score: 20    End of Session   Activity Tolerance: Patient tolerated treatment well Patient left: in chair;with call bell/phone within reach;with chair alarm set;with nursing/sitter in room Nurse Communication: Mobility status PT Visit Diagnosis: Other abnormalities of gait and mobility (R26.89)    Time: 4431-5400 PT Time Calculation (min) (ACUTE ONLY): 28 min   Charges:   PT Evaluation $PT Eval Low Complexity: 1 Low PT Treatments $Therapeutic Activity: 8-22 mins   Mabeline Caras, PT, DPT Acute Rehabilitation Services  Pager [phone] Office [place] 02/24/2020, 9:46 AM

## 2020-02-24 NOTE — Evaluation (Signed)
Occupational Therapy Evaluation Patient Details Name: Paul [name] MRN: [id number] DOB: 1975/12/29 Today's Date: 02/24/2020    History of Present Illness Pt is a 44 y.o. male admitted 02/23/20 with L distal femur fx from playing soccer. S/p L femoral IM nail 4/26 ([facility]). No pertinent PMH on file.   Clinical Impression   PTA, pt was living at home with his wife, pt was independent wih ADL/IADL and functional mobility. Pt currently requires supervision for functional mobility at RW level. He required minA for access to L foot during LB dressing, pt reports wife will be able to assist with this, wife agrees. Feel pt will continue to progress toward baseline with support from wife at d/c and continued PT services with no acute follow-up. Will continue to follow to address independence with LB dressing and safety with shower transfers. Will continue to follow acutely.     Follow Up Recommendations  Supervision - Intermittent;No OT follow up    Equipment Recommendations  3 in 1 bedside commode    Recommendations for Other Services       Precautions / Restrictions Precautions Precautions: Fall Restrictions Weight Bearing Restrictions: Yes LLE Weight Bearing: Weight bearing as tolerated      Mobility Bed Mobility Overal bed mobility: Modified Independent             General bed mobility comments: OOB in bathroom upon arrival  Transfers Overall transfer level: Needs assistance Equipment used: Rolling walker (2 wheeled);Crutches Transfers: Sit to/from Stand Sit to Stand: Supervision         General transfer comment: supervision for safety    Balance Overall balance assessment: Needs assistance   Sitting balance-Leahy Scale: Normal     Standing balance support: No upper extremity supported;During functional activity Standing balance-Leahy Scale: Fair Standing balance comment: Can static stand without UE support; reliant on UE support for dynamic stability                            ADL either performed or assessed with clinical judgement   ADL Overall ADL's : Needs assistance/impaired Eating/Feeding: Set up;Sitting   Grooming: Supervision/safety;Standing   Upper Body Bathing: Set up;Sitting   Lower Body Bathing: Minimal assistance;Sit to/from stand Lower Body Bathing Details (indicate cue type and reason): minA for access Upper Body Dressing : Set up;Sitting   Lower Body Dressing: Minimal assistance Lower Body Dressing Details (indicate cue type and reason): minA to don sock/item over feet Toilet Transfer: Supervision/safety   Toileting- Clothing Manipulation and Hygiene: Supervision/safety       Functional mobility during ADLs: Supervision/safety;Rolling walker General ADL Comments: supervision for safety;pt with good stability at RW level, good safety awareness     Vision         Perception     Praxis      Pertinent Vitals/Pain Pain Assessment: 0-10 Pain Score: 4  Pain Location: L thigh/knee Pain Descriptors / Indicators: Sore;Discomfort Pain Intervention(s): Monitored during session;Limited activity within patient's tolerance     Hand Dominance Right   Extremity/Trunk Assessment Upper Extremity Assessment Upper Extremity Assessment: Overall WFL for tasks assessed   Lower Extremity Assessment Lower Extremity Assessment: Defer to PT evaluation LLE Deficits / Details: s/p L distal femoral IMN; able to achieve LAQ and knee flexion, ankle DF/PF at least 3/5; unable to perform full SLR but can do quad set LLE: Unable to fully assess due to immobilization LLE Coordination: decreased gross motor   Cervical / Trunk Assessment  Cervical / Trunk Assessment: Normal   Communication Communication Communication: No difficulties   Cognition Arousal/Alertness: Awake/[name] Behavior During Therapy: WFL for tasks assessed/performed Overall Cognitive Status: Within Functional Limits for tasks assessed                                      General Comments  education on edema control, proper positioning of LLE, and DVT prevention;wife present during session    Exercises    Shoulder Instructions      Home Living Family/patient expects to be discharged to:: Private residence Living Arrangements: Spouse/significant other Available Help at Discharge: Family;Available 24 hours/day Type of Home: House Home Access: Stairs to enter [employer] of Steps: 5 Entrance Stairs-Rails: Right;Left Home Layout: One level     Bathroom Shower/Tub: Chief Strategy Officer: Standard     Home Equipment: None   Additional Comments: Lives with wife and children; daughter about to start undergrad at [employer]      Prior Functioning/Environment Level of Independence: Independent        Comments: Works. Enjoys playing on soccer league; has played soccer his whole life        OT Problem List: Decreased range of motion;Decreased safety awareness;Decreased knowledge of precautions;Pain      OT Treatment/Interventions: DME and/or AE instruction;Therapeutic exercise;Self-care/ADL training;Patient/family education    OT Goals(Current goals can be found in the care plan section) Acute Rehab OT Goals Patient Stated Goal: to get back to playing soccer soon OT Goal Formulation: With patient Time For Goal Achievement: 03/09/20 Potential to Achieve Goals: Good  OT Frequency: Min 2X/week   Barriers to D/C:            Co-evaluation              AM-PAC OT "6 Clicks" Daily Activity     Outcome Measure Help from another person eating meals?: A Little Help from another person taking care of personal grooming?: A Little Help from another person toileting, which includes using toliet, bedpan, or urinal?: A Little Help from another person bathing (including washing, rinsing, drying)?: A Little Help from another person to put on and taking off regular upper body clothing?: A  Little Help from another person to put on and taking off regular lower body clothing?: A Little 6 Click Score: 18   End of Session Equipment Utilized During Treatment: Gait belt;Rolling walker Nurse Communication: Mobility status  Activity Tolerance: Patient tolerated treatment well Patient left: in chair;with chair alarm set;with call bell/phone within reach;with family/visitor present  OT Visit Diagnosis: Other abnormalities of gait and mobility (R26.89);Pain Pain - Right/Left: Left Pain - part of body: Knee;Leg                Time: 3818-2993 OT Time Calculation (min): 17 min Charges:  OT General Charges $OT Visit: 1 Visit OT Evaluation $OT Eval Low Complexity: 1 Low  Paul [name] OTR/L Acute Rehabilitation Services Office: [phone]   Paul [name] 02/24/2020, 10:24 AM

## 2020-10-28 IMAGING — RF DG FEMUR 2+V*L*
1 series · 10 of 10 positions shown · non-contrast
Comparison: 02/22/2020

CLINICAL DATA: ORIF left femur

EXAM:
LEFT FEMUR 2 VIEWS; DG C-ARM 1-60 MIN

[Series 1: run · 10 of 10 slices shown]
[im 1/10]
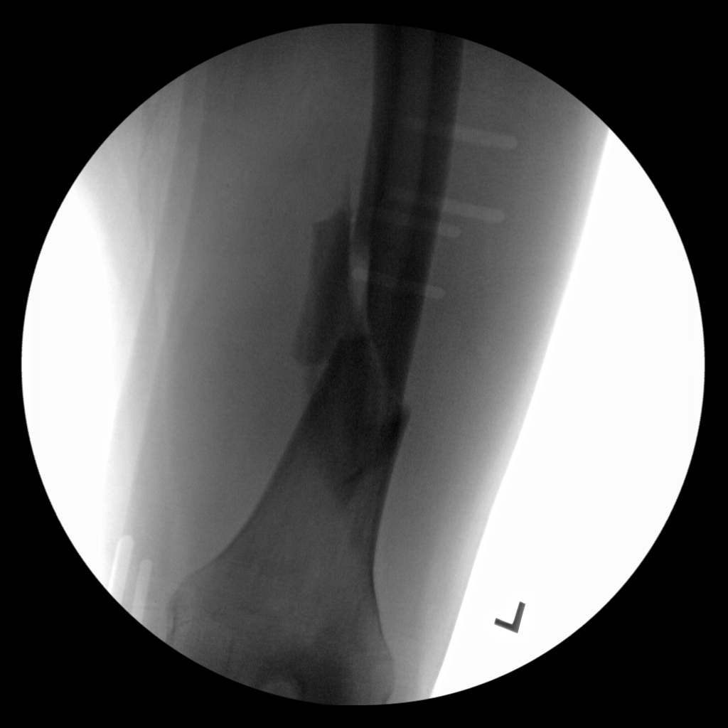
[im 2/10]
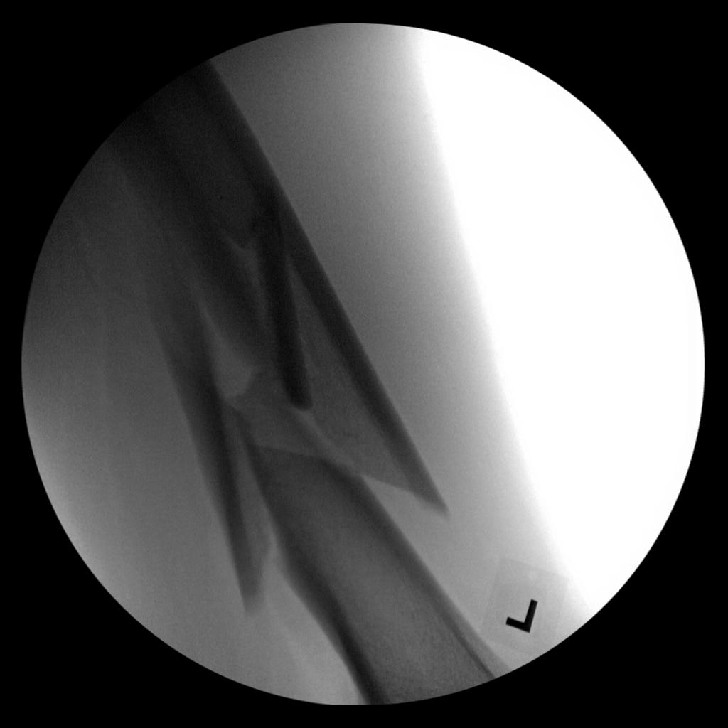
[im 3/10]
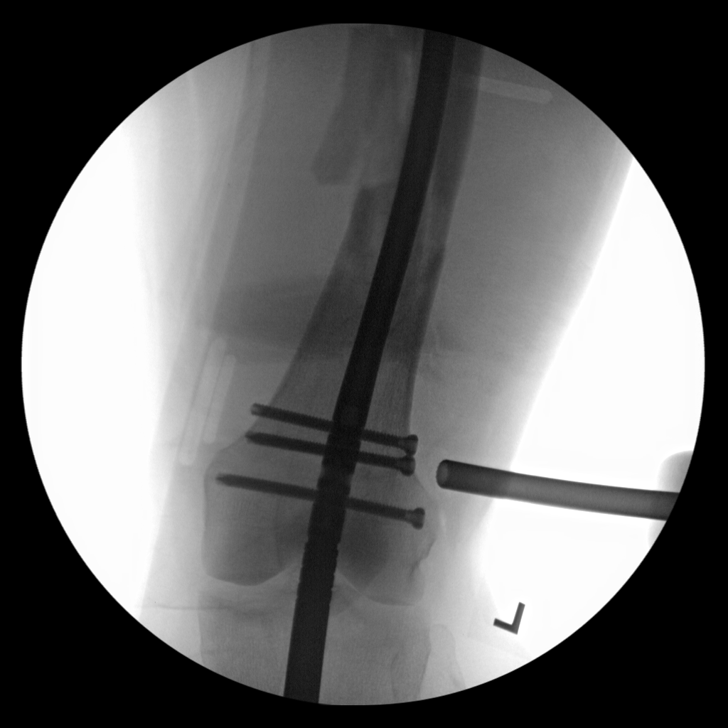
[im 4/10]
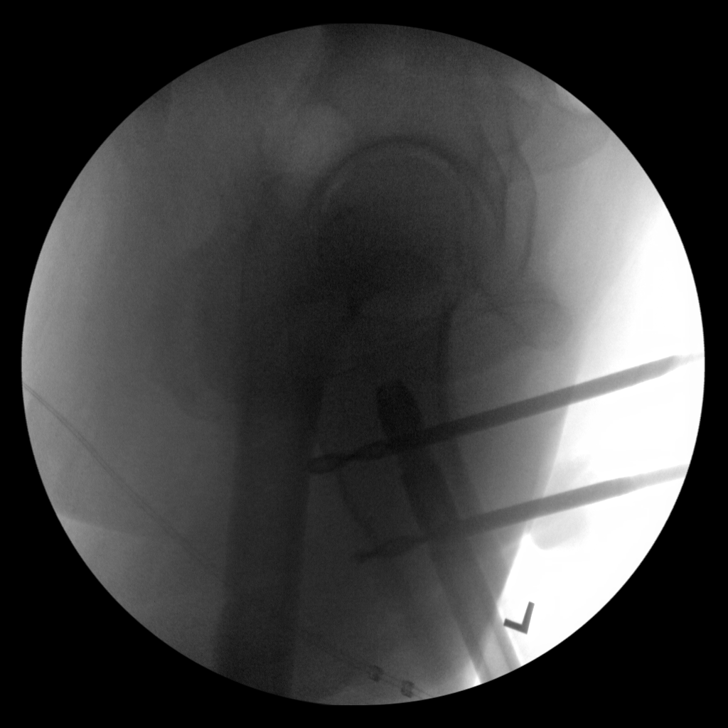
[im 5/10]
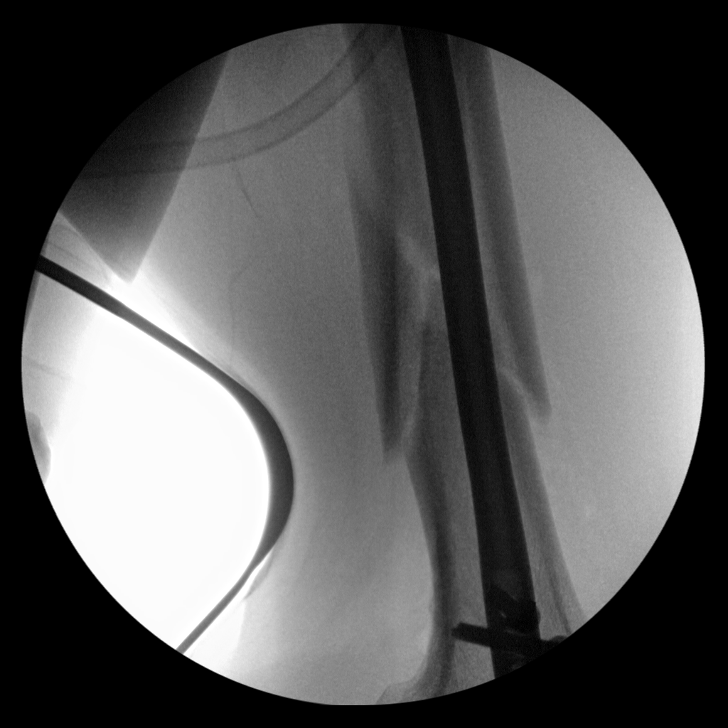
[im 6/10]
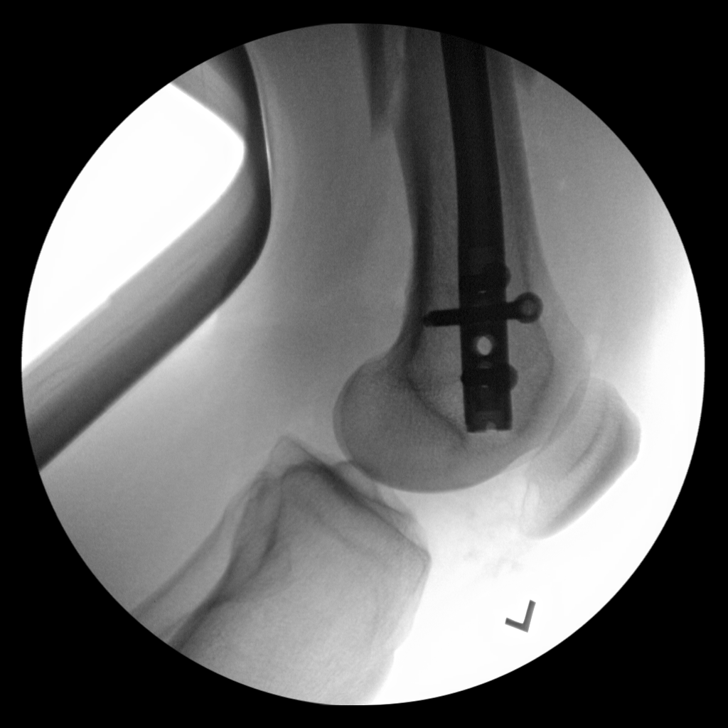
[im 7/10]
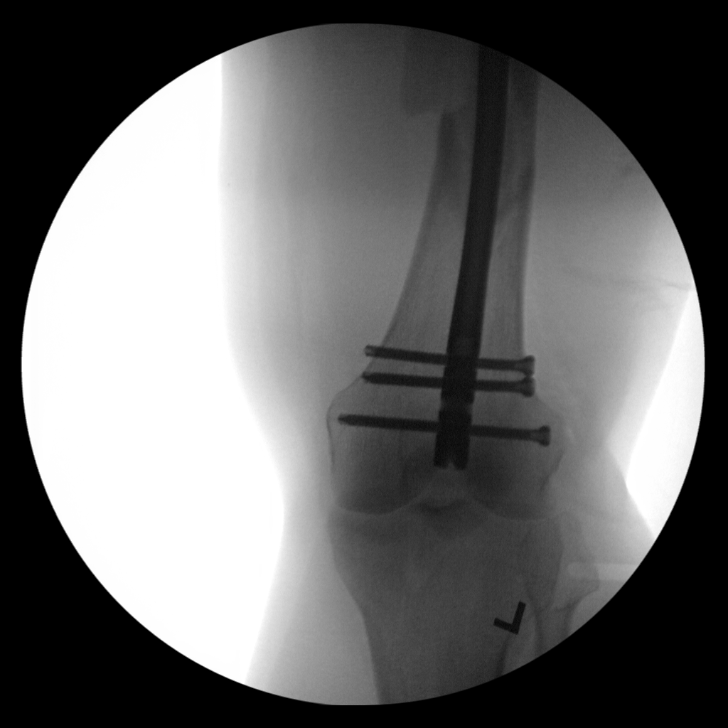
[im 8/10]
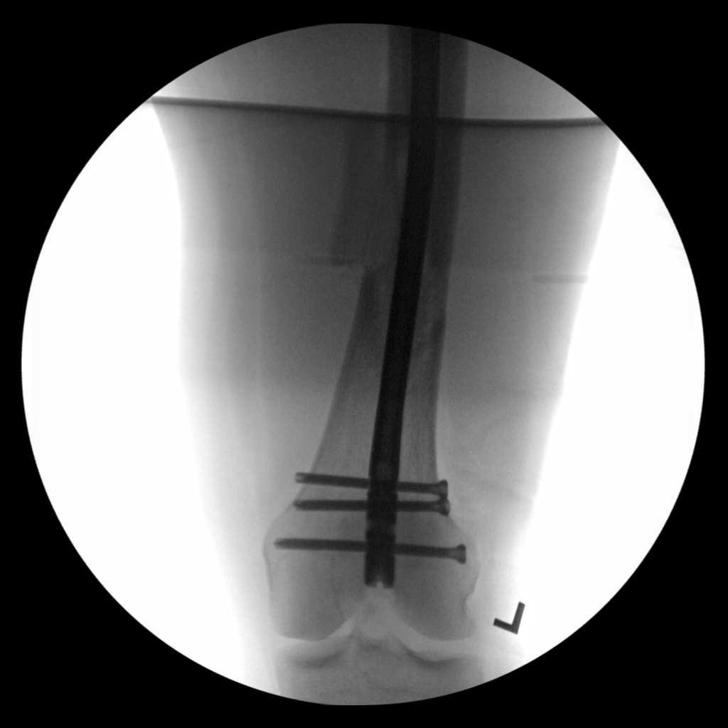
[im 9/10]
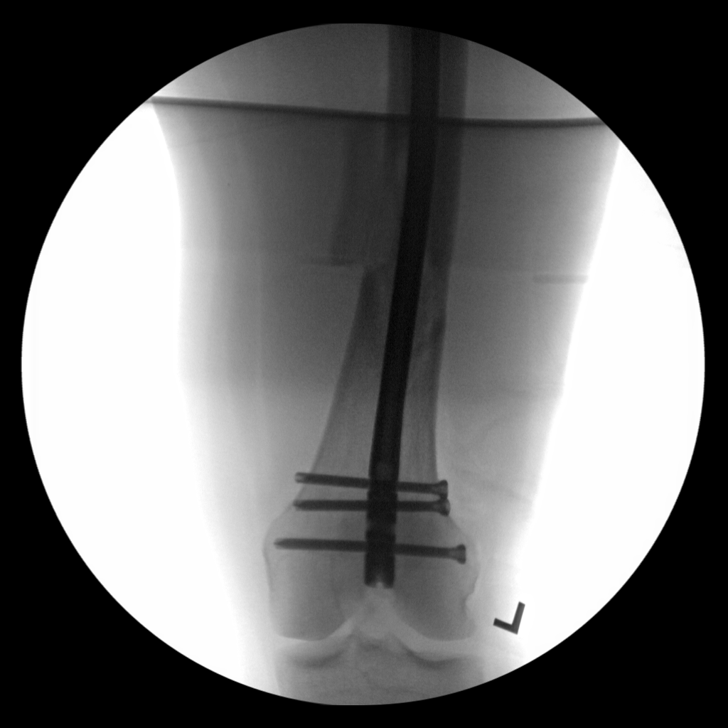
[im 10/10]
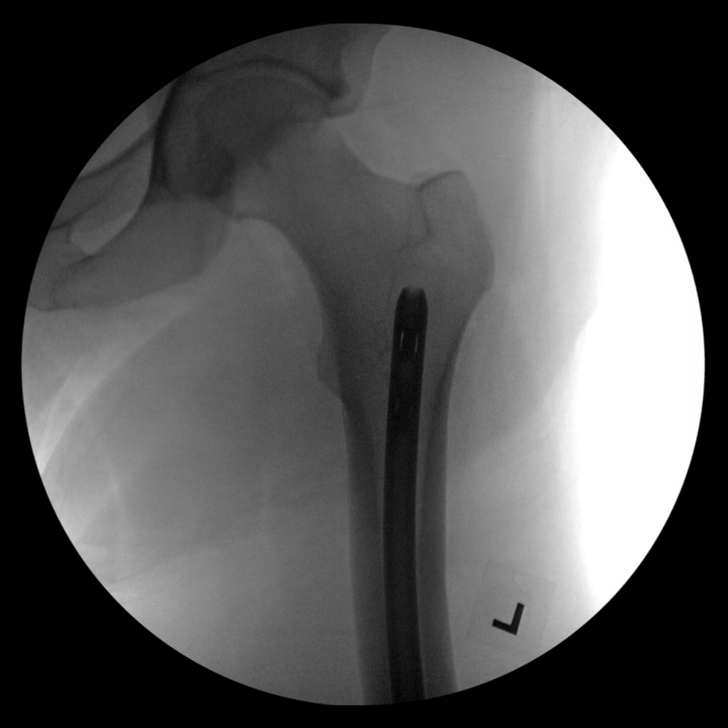

[10 of 10 positions shown; findings below may reference images not displayed]

FINDINGS: Ten fluoroscopic images are obtained during the performance of the
procedure and are provided for interpretation only. Images
demonstrate intramedullary rod spanning the comminuted femoral
fracture seen previously. There are proximal and distal interlocking
screws. Alignment is anatomic.

FLUOROSCOPY TIME:  2 minutes 12 seconds
IMPRESSION: 1. ORIF left femur fracture as above.

## 2024-08-20 ENCOUNTER — Other Ambulatory Visit (HOSPITAL_COMMUNITY): Payer: Self-pay

## 2024-08-20 DIAGNOSIS — M542 Cervicalgia: Secondary | ICD-10-CM

## 2024-09-06 ENCOUNTER — Ambulatory Visit (HOSPITAL_COMMUNITY)
Admission: RE | Admit: 2024-09-06 | Discharge: 2024-09-06 | Disposition: A | Discharge status: Home / Self Care | Source: Ambulatory Visit

## 2024-09-06 DIAGNOSIS — M542 Cervicalgia: Secondary | ICD-10-CM | POA: Diagnosis present
